# Patient Record
Sex: Male | Born: 1985 | Race: Black or African American | Hispanic: No | Marital: Single | Smoking: Former smoker
Health system: Southern US, Community
[De-identification: ages and names within clinical notes are randomized; demographics above are authoritative.]

## PROBLEM LIST (undated history)

## (undated) DIAGNOSIS — Z21 Asymptomatic human immunodeficiency virus [HIV] infection status: Secondary | ICD-10-CM

## (undated) DIAGNOSIS — I2699 Other pulmonary embolism without acute cor pulmonale: Secondary | ICD-10-CM

---

## 2006-02-15 ENCOUNTER — Ambulatory Visit: Payer: Self-pay | Admitting: Family Medicine

## 2007-06-04 IMAGING — CR DG CHEST 2V
1 series · 2 of 2 positions shown · non-contrast
Comparison: none

REASON FOR EXAM: asthma, cough, shortness of breath
COMMENTS:

PROCEDURE:     DXR - DXR CHEST PA (OR AP) AND LATERAL  - February 15, 2006 [DATE]
RESULT:          The lung fields are clear.  No pneumonia, pneumothorax, or
pleural effusion is seen.  The heart, mediastinal and osseous structures
show no significant abnormalities.

[Series 1: view not recorded · 0.17mm/px · 2 of 2 slices shown]
[im 1/2]
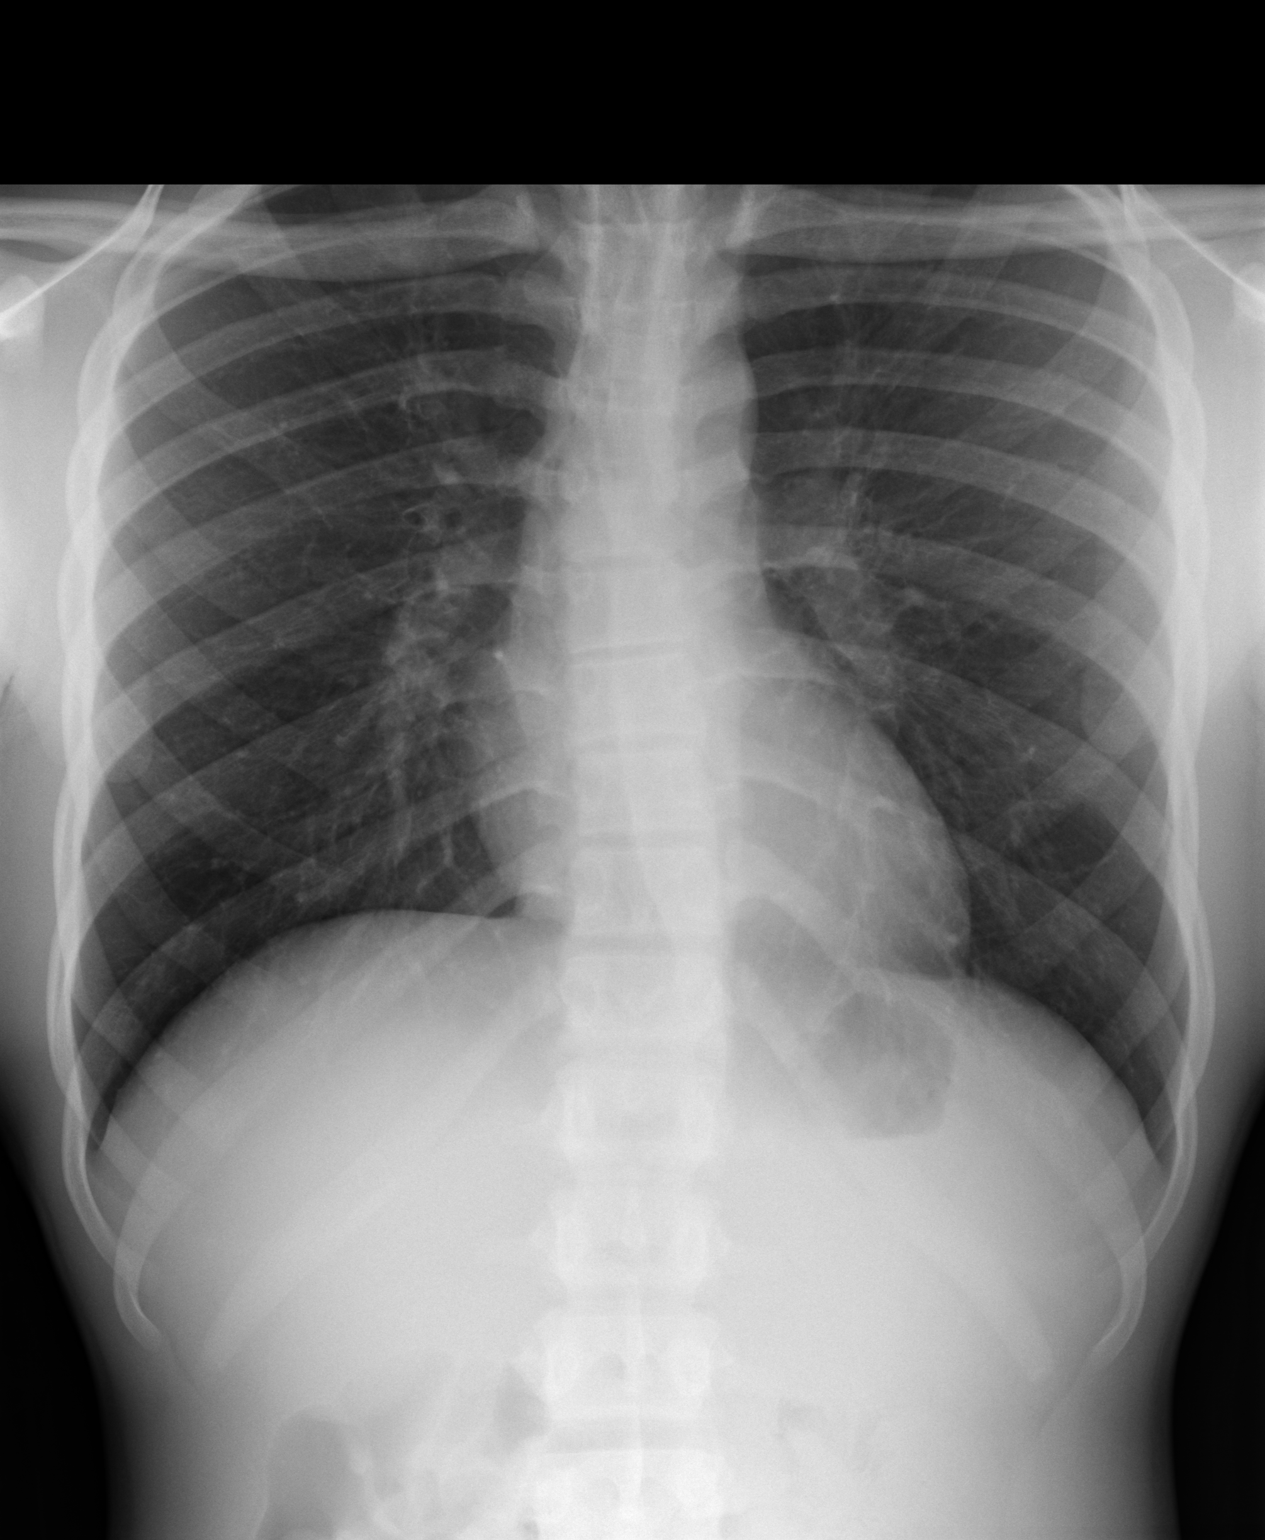
[im 2/2]
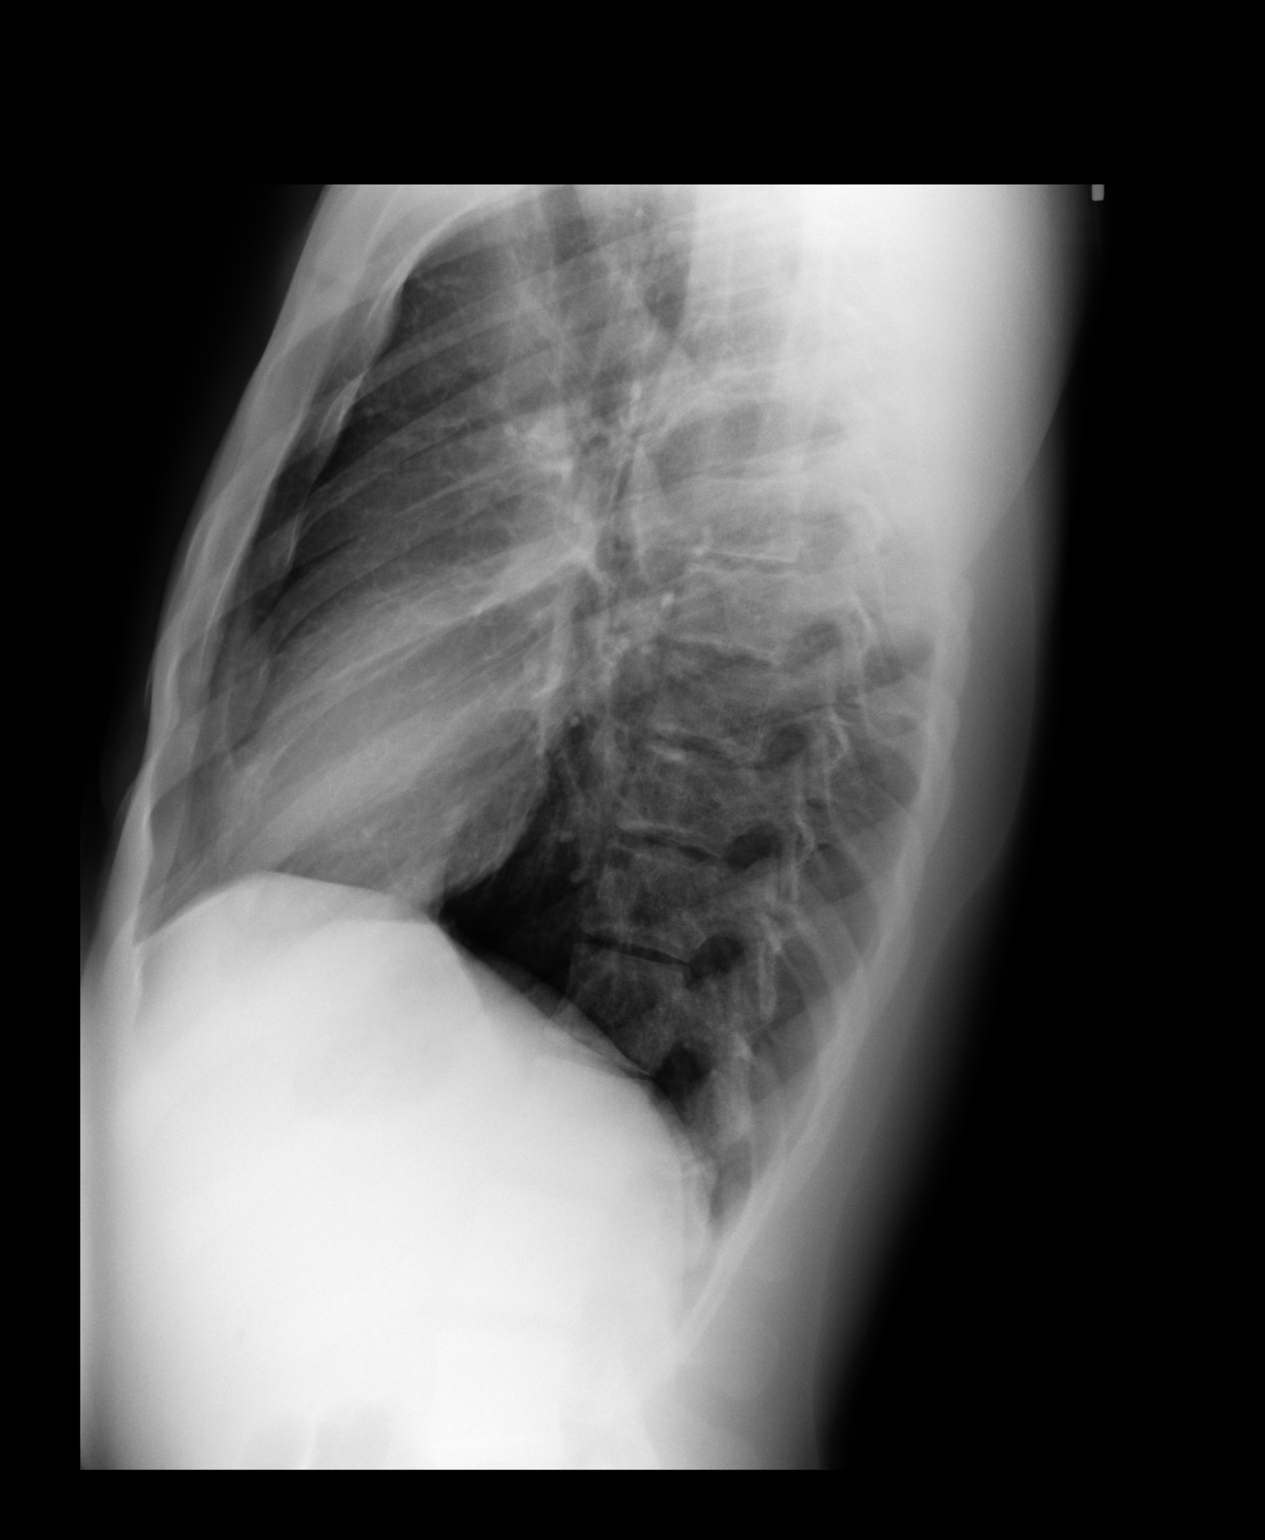

[2 of 2 positions shown; findings below may reference images not displayed]

IMPRESSION: No acute changes are identified.

## 2008-01-18 ENCOUNTER — Inpatient Hospital Stay: Payer: Self-pay | Admitting: Psychiatry

## 2008-04-28 ENCOUNTER — Emergency Department: Payer: Self-pay | Admitting: Emergency Medicine

## 2009-01-16 ENCOUNTER — Emergency Department: Payer: Self-pay | Admitting: Unknown Physician Specialty

## 2010-12-10 ENCOUNTER — Ambulatory Visit: Payer: Self-pay | Admitting: Internal Medicine

## 2012-03-28 IMAGING — CR DG CHEST 2V
1 series · 3 of 3 positions shown · non-contrast
Comparison: none

REASON FOR EXAM: pain with deep breath
COMMENTS:

PROCEDURE:     MDR - MDR CHEST PA(OR AP) AND LATERAL  - December 10, 2010 [DATE]
RESULT:     Comparison: None

[Series 1: view not recorded · 0.17mm/px · 3 of 3 slices shown]
[im 1/3]
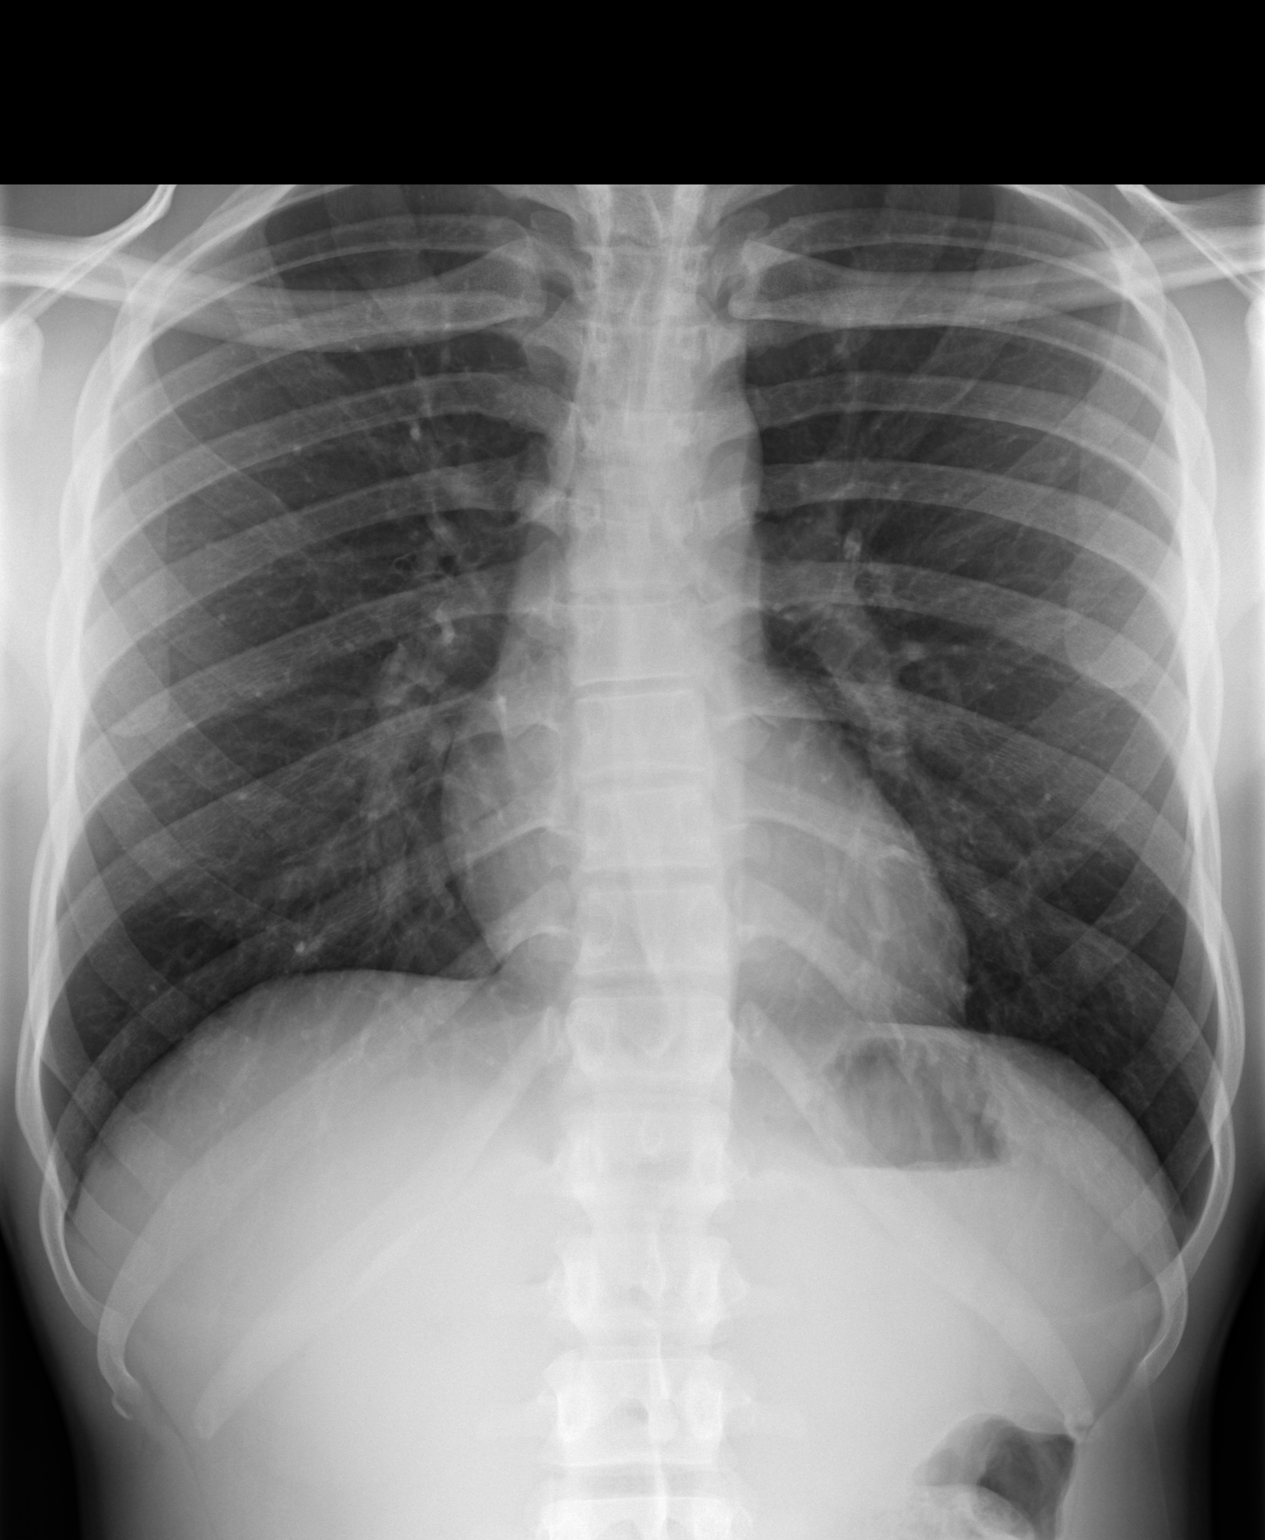
[im 2/3]
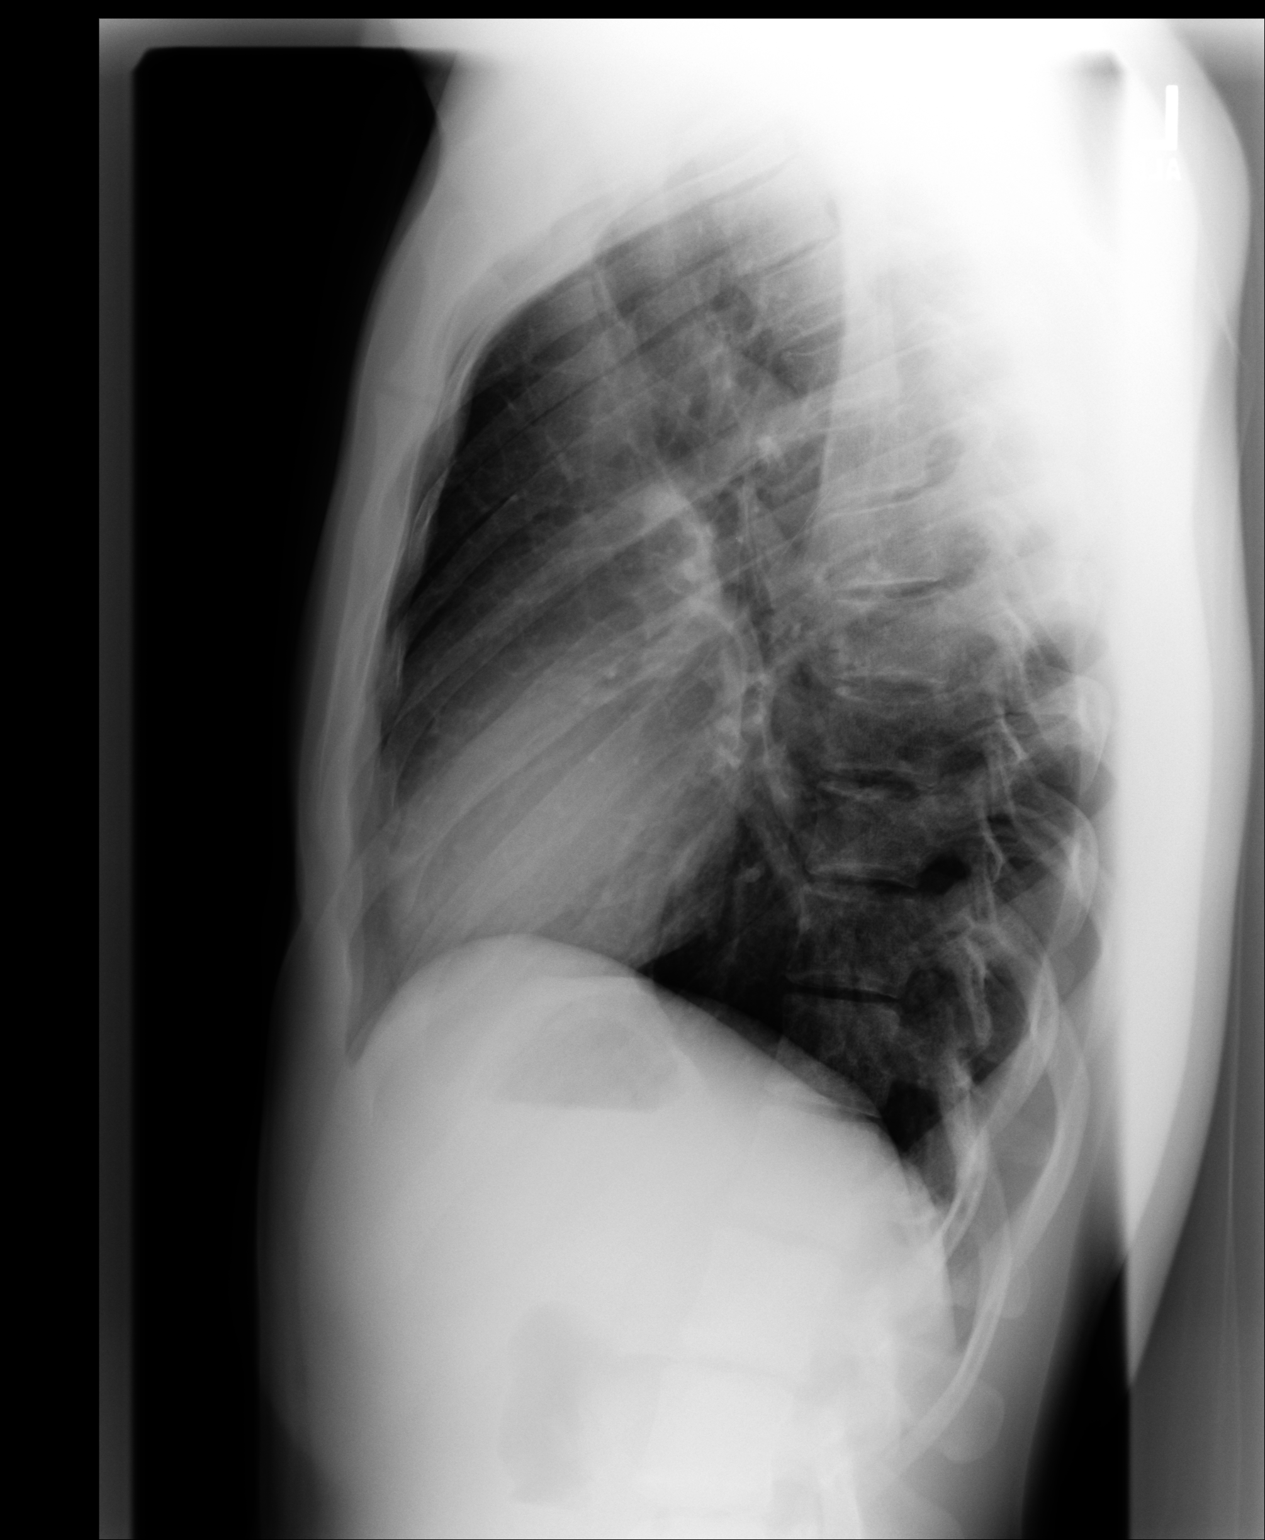
[im 3/3]
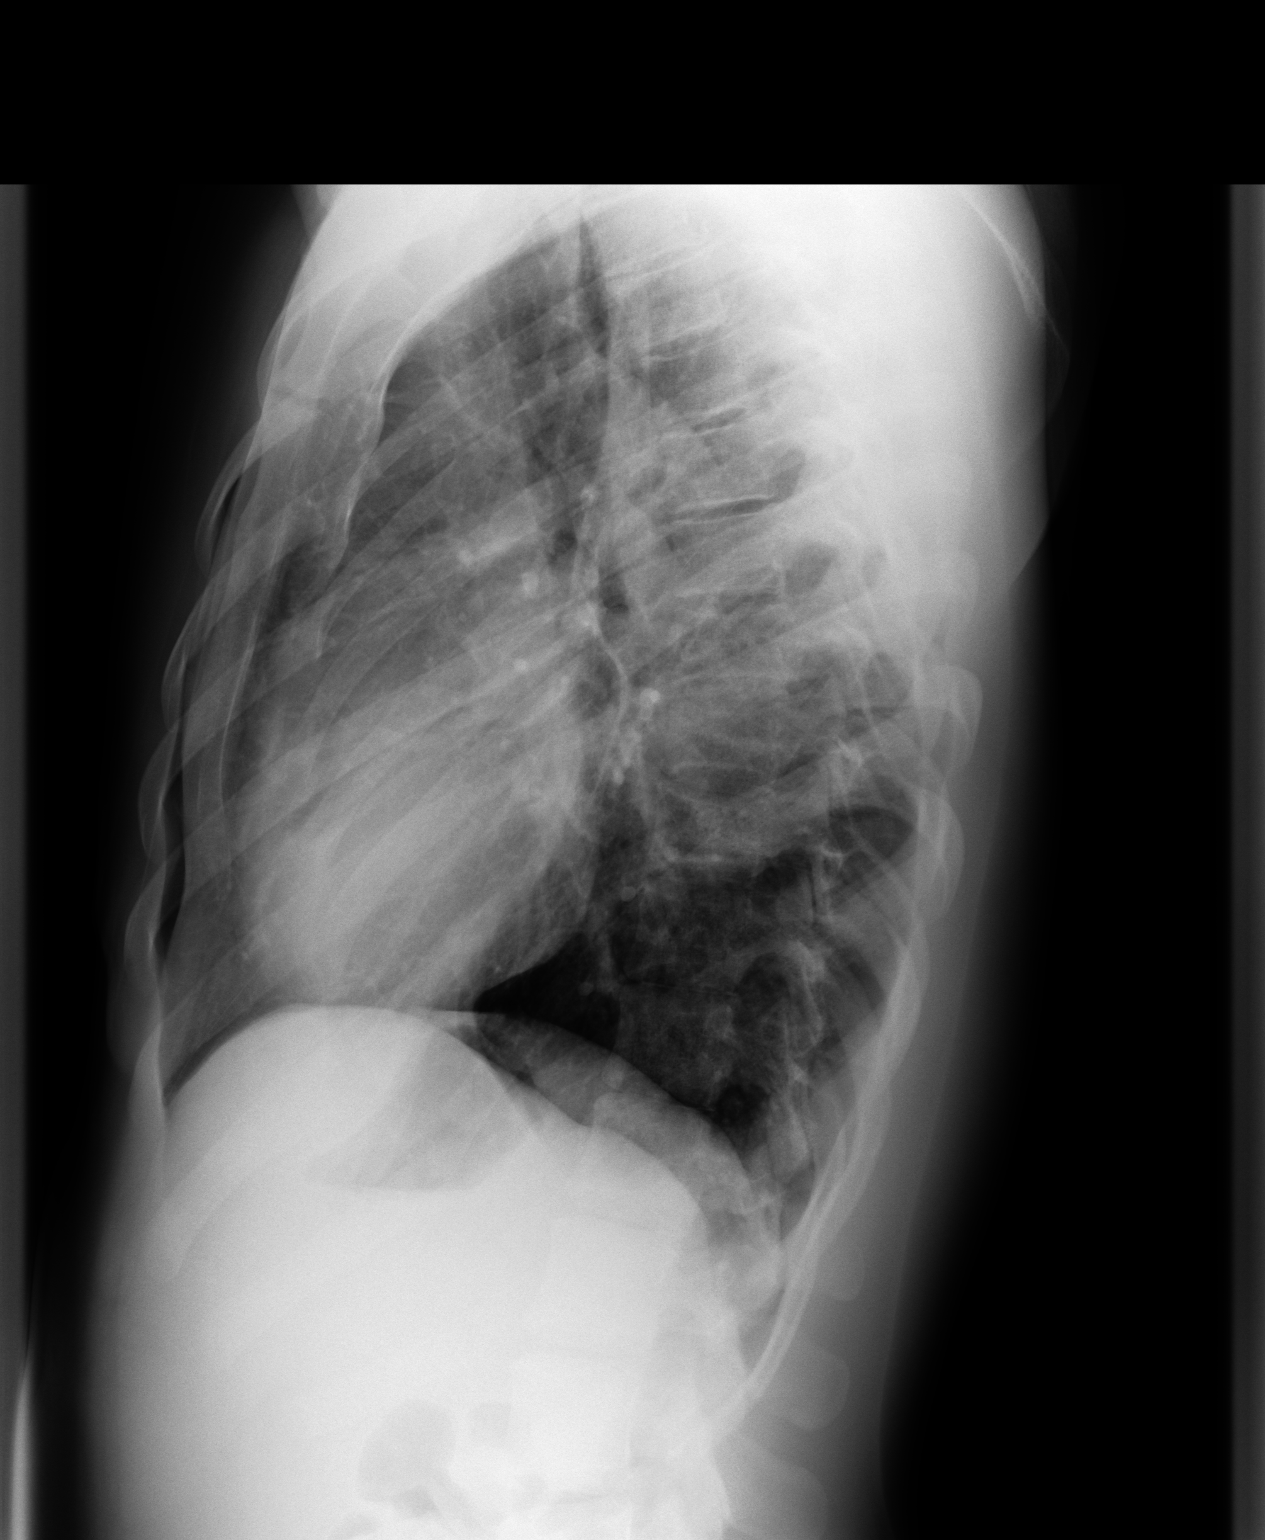

[3 of 3 positions shown; findings below may reference images not displayed]

FINDINGS: PA and lateral chest radiographs are provided.  There is no focal
parenchymal opacity, pleural effusion, or pneumothorax. The heart and
mediastinum are unremarkable.  The osseous structures are unremarkable.
IMPRESSION: No acute disease of the chest.

## 2018-09-16 ENCOUNTER — Encounter: Payer: Self-pay | Admitting: Emergency Medicine

## 2018-09-16 ENCOUNTER — Emergency Department
Admission: EM | Admit: 2018-09-16 | Discharge: 2018-09-16 | Disposition: A | Discharge status: Home / Self Care | Payer: Self-pay | Attending: Emergency Medicine | Admitting: Emergency Medicine

## 2018-09-16 ENCOUNTER — Other Ambulatory Visit: Payer: Self-pay

## 2018-09-16 DIAGNOSIS — Y939 Activity, unspecified: Secondary | ICD-10-CM | POA: Insufficient documentation

## 2018-09-16 DIAGNOSIS — W57XXXA Bitten or stung by nonvenomous insect and other nonvenomous arthropods, initial encounter: Secondary | ICD-10-CM | POA: Insufficient documentation

## 2018-09-16 DIAGNOSIS — T63304A Toxic effect of unspecified spider venom, undetermined, initial encounter: Secondary | ICD-10-CM

## 2018-09-16 DIAGNOSIS — Y929 Unspecified place or not applicable: Secondary | ICD-10-CM | POA: Insufficient documentation

## 2018-09-16 DIAGNOSIS — Y999 Unspecified external cause status: Secondary | ICD-10-CM | POA: Insufficient documentation

## 2018-09-16 DIAGNOSIS — S0086XA Insect bite (nonvenomous) of other part of head, initial encounter: Secondary | ICD-10-CM | POA: Insufficient documentation

## 2018-09-16 NOTE — ED Triage Notes (Signed)
Pt to ED via POV, pt states that he has a bite on his forehead, thinks that it came from a spider. Pt has spider with him.

## 2018-09-16 NOTE — Discharge Instructions (Addendum)
Follow-up with [facility] acute care if any continued problems.  Do not touch, rub or scratch the spider bite area which will increase her chances of getting infected.  You may rub Benadryl cream to it if there is any itching.  If any severe itching you may take Benadryl tablets 1 or 2 every 6 hours but be aware that this medication could cause drowsiness and increase your risk for injury.

## 2018-09-16 NOTE — ED Provider Notes (Signed)
[facility] Emergency Department Provider Note  ____________________________________________   First MD Initiated Contact with Patient 09/16/18 [phone]     (approximate)  I have reviewed the triage vital signs and the nursing notes.   HISTORY  Chief Complaint Insect Bite   HPI Marcus Allison is a 33 y.o. male presents to the ED with complaint of a spider bite to his forehead approximately 11 PM last evening.  Patient states that there is been some itching in the area.  He has not taken any over-the-counter medication or used any topical cream.     History reviewed. No pertinent past medical history.  There are no active problems to display for this patient.   Prior to Admission medications   Not on File    Allergies Tramadol  No family history on file.  Social History Social History   Tobacco Use  . Smoking status: Not on file  Substance Use Topics  . Alcohol use: Not on file  . Drug use: Not on file    Review of Systems Constitutional: No fever/chills Eyes: No visual changes. ENT: No sore throat. Cardiovascular: Denies chest pain. Respiratory: Denies shortness of breath. Gastrointestinal: No abdominal pain.  No nausea, no vomiting. Musculoskeletal: Negative for back pain. Skin: Spider bite. Neurological: Negative for headaches, focal weakness or numbness. ___________________________________________   PHYSICAL EXAM:  VITAL SIGNS: ED Triage Vitals  Enc Vitals Group     BP 09/16/18 0856 140/84     Pulse Rate 09/16/18 0856 70     Resp 09/16/18 0856 18     Temp 09/16/18 0856 98.1 F (36.7 C)     Temp Source 09/16/18 0856 Oral     SpO2 09/16/18 0856 98 %     Weight --      Height --      Head Circumference --      Peak Flow --      Pain Score 09/16/18 0853 5     Pain Loc --      Pain Edu? --      Excl. in [place]? --    Constitutional: Alert and oriented. Well appearing and in no acute distress. Eyes: Conjunctivae are  normal. PERRL. EOMI. Head: Atraumatic. Nose: No congestion/rhinnorhea. Neck: No stridor.   Cardiovascular: Normal rate, regular rhythm. Grossly normal heart sounds.  Good peripheral circulation. Respiratory: Normal respiratory effort.  No retractions. Lungs CTAB. Musculoskeletal: Moves upper and lower extremities without any difficulty.  Normal gait was noted. Neurologic:  Normal speech and language. No gross focal neurologic deficits are appreciated.  Skin:  Skin is warm, dry and intact. No rash noted.  Single alleged spider bite over the left eyebrow. Psychiatric: Mood and affect are normal. Speech and behavior are normal.  ____________________________________________   LABS (all labs ordered are listed, but only abnormal results are displayed)  Labs Reviewed - No data to display ____________________________________________  PROCEDURES  Procedure(s) performed (including Critical Care):  Procedures   ____________________________________________   INITIAL IMPRESSION / ASSESSMENT AND PLAN / ED COURSE  As part of my medical decision making, I reviewed the following data within the electronic MEDICAL RECORD NUMBER Notes from prior ED visits and [place] Controlled Substance Database     33 year old male presents to the ED with complaint of a spider bite above his left eyebrow that occurred at 11 PM last evening.  Patient has the spider with him which appears to be possibly a wolf spider.  Patient was anxious that this was  a brown recluse.  On examination of the spider bite there is a red linear area approximately 0.5 cm without vesiculation or necrosis.  Patient was noted to be rubbing this area while getting information from him.  Patient was instructed to use Benadryl cream to the area if needed for itching and also Benadryl tablets if continued itching.  He is to watch the area for any signs of infection and [facility] if any continued problems.          ____________________________________________   FINAL CLINICAL IMPRESSION(S) / ED DIAGNOSES  Final diagnoses:  Spider bite wound, undetermined intent, initial encounter     ED Discharge Orders    None       Note:  This document was prepared using Dragon voice recognition software and may include unintentional dictation errors.    Tommi RumpsSummers, Garima Chronis L, PA-C 09/16/18 [id number]    Sharyn CreamerQuale, Mark, MD 09/16/18 1454

## 2019-06-11 ENCOUNTER — Ambulatory Visit: Payer: Self-pay

## 2023-08-18 ENCOUNTER — Other Ambulatory Visit: Payer: Self-pay

## 2023-08-18 ENCOUNTER — Emergency Department

## 2023-08-18 ENCOUNTER — Emergency Department
Admission: EM | Admit: 2023-08-18 | Discharge: 2023-08-18 | Disposition: A | Discharge status: Home / Self Care | Attending: Emergency Medicine | Admitting: Emergency Medicine

## 2023-08-18 ENCOUNTER — Encounter: Payer: Self-pay | Admitting: Emergency Medicine

## 2023-08-18 DIAGNOSIS — Z7901 Long term (current) use of anticoagulants: Secondary | ICD-10-CM | POA: Insufficient documentation

## 2023-08-18 DIAGNOSIS — S60811A Abrasion of right wrist, initial encounter: Secondary | ICD-10-CM | POA: Diagnosis not present

## 2023-08-18 DIAGNOSIS — Y92410 Unspecified street and highway as the place of occurrence of the external cause: Secondary | ICD-10-CM | POA: Diagnosis not present

## 2023-08-18 DIAGNOSIS — S6991XA Unspecified injury of right wrist, hand and finger(s), initial encounter: Secondary | ICD-10-CM | POA: Diagnosis present

## 2023-08-18 DIAGNOSIS — Z21 Asymptomatic human immunodeficiency virus [HIV] infection status: Secondary | ICD-10-CM | POA: Insufficient documentation

## 2023-08-18 DIAGNOSIS — M7918 Myalgia, other site: Secondary | ICD-10-CM

## 2023-08-18 HISTORY — DX: Asymptomatic human immunodeficiency virus (hiv) infection status: Z21

## 2023-08-18 HISTORY — DX: Other pulmonary embolism without acute cor pulmonale: I26.99

## 2023-08-18 MED ORDER — ACETAMINOPHEN 500 MG PO TABS
1000.0000 mg | ORAL_TABLET | Freq: Once | ORAL | Status: AC
  Administered 2023-08-18: 1000 mg via ORAL
  Filled 2023-08-18: qty 2

## 2023-08-18 MED ORDER — OXYCODONE HCL 5 MG PO TABS
5.0000 mg | ORAL_TABLET | Freq: Once | ORAL | Status: AC
  Administered 2023-08-18: 5 mg via ORAL
  Filled 2023-08-18: qty 1
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
## 2023-09-06 ENCOUNTER — Emergency Department
Admission: EM | Admit: 2023-09-06 | Discharge: 2023-09-06 | Disposition: A | Discharge status: Home / Self Care | Attending: Emergency Medicine | Admitting: Emergency Medicine

## 2023-09-06 ENCOUNTER — Emergency Department

## 2023-09-06 ENCOUNTER — Other Ambulatory Visit: Payer: Self-pay

## 2023-09-06 ENCOUNTER — Encounter: Payer: Self-pay | Admitting: *Deleted

## 2023-09-06 DIAGNOSIS — R1032 Left lower quadrant pain: Secondary | ICD-10-CM | POA: Insufficient documentation

## 2023-09-06 DIAGNOSIS — Z21 Asymptomatic human immunodeficiency virus [HIV] infection status: Secondary | ICD-10-CM | POA: Diagnosis not present

## 2023-09-06 LAB — COMPREHENSIVE METABOLIC PANEL WITH GFR
ALT: 19 U/L (ref 0–44)
AST: 20 U/L (ref 15–41)
Albumin: 4.2 g/dL (ref 3.5–5.0)
Alkaline Phosphatase: 55 U/L (ref 38–126)
Anion gap: 10 (ref 5–15)
BUN: 7 mg/dL (ref 6–20)
CO2: 25 mmol/L (ref 22–32)
Calcium: 9.3 mg/dL (ref 8.9–10.3)
Chloride: 99 mmol/L (ref 98–111)
Creatinine, Ser: 1.05 mg/dL (ref 0.61–1.24)
GFR, Estimated: >60 mL/min (ref >60)
Glucose, Bld: 114 mg/dL — ABNORMAL HIGH (ref 70–99)
Potassium: 3.3 mmol/L — ABNORMAL LOW (ref 3.5–5.1)
Sodium: 134 mmol/L — ABNORMAL LOW (ref 135–145)
Total Bilirubin: 0.6 mg/dL (ref 0.0–1.2)
Total Protein: 8.1 g/dL (ref 6.5–8.1)

## 2023-09-06 LAB — URINALYSIS, ROUTINE W REFLEX MICROSCOPIC
Bilirubin Urine: NEGATIVE
Glucose, UA: NEGATIVE mg/dL
Hgb urine dipstick: NEGATIVE
Ketones, ur: NEGATIVE mg/dL
Leukocytes,Ua: NEGATIVE
Nitrite: NEGATIVE
Protein, ur: NEGATIVE mg/dL
Specific Gravity, Urine: 1.011 (ref 1.005–1.030)
pH: 8 (ref 5.0–8.0)

## 2023-09-06 LAB — CBC
HCT: 43.5 % (ref 39.0–52.0)
Hemoglobin: 15.4 g/dL (ref 13.0–17.0)
MCH: 32.5 pg (ref 26.0–34.0)
MCHC: 35.4 g/dL (ref 30.0–36.0)
MCV: 91.8 fL (ref 80.0–100.0)
Platelets: 271 10*3/uL (ref 150–400)
RBC: 4.74 MIL/uL (ref 4.22–5.81)
RDW: 12.5 % (ref 11.5–15.5)
WBC: 6.6 10*3/uL (ref 4.0–10.5)
nRBC: 0 % (ref 0.0–0.2)

## 2023-09-06 LAB — LIPASE, BLOOD: Lipase: 32 U/L (ref 11–51)

## 2023-09-06 MED ORDER — ONDANSETRON HCL 4 MG/2ML IJ SOLN
4.0000 mg | INTRAMUSCULAR | Status: AC
  Administered 2023-09-06: 4 mg via INTRAVENOUS
  Filled 2023-09-06: qty 2

## 2023-09-06 MED ORDER — MORPHINE SULFATE (PF) 4 MG/ML IV SOLN
4.0000 mg | Freq: Once | INTRAVENOUS | Status: AC
  Administered 2023-09-06: 4 mg via INTRAVENOUS
  Filled 2023-09-06: qty 1

## 2023-09-06 MED ORDER — IOHEXOL 300 MG/ML  SOLN
100.0000 mL | Freq: Once | INTRAMUSCULAR | Status: AC | PRN
  Administered 2023-09-06: 100 mL via INTRAVENOUS

## 2023-09-06 MED ORDER — KETOROLAC TROMETHAMINE 30 MG/ML IJ SOLN
15.0000 mg | Freq: Once | INTRAMUSCULAR | Status: AC
  Administered 2023-09-06: 15 mg via INTRAVENOUS
  Filled 2023-09-06: qty 1

## 2023-09-06 NOTE — ED Notes (Signed)
 ED Provider at bedside.

## 2023-09-06 NOTE — ED Provider Notes (Signed)
 [facility] Provider Note    Event Date/Time   First MD Initiated Contact with Patient 09/06/23 [phone]     (approximate)   History   Abdominal Pain   HPI Marcus Allison is a 38 y.o. 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  He also reports prior appendectomy.  He is followed by [facility] infectious disease and said he is compliant with his medication regimen and most recently had an undetectable viral load.  He presents tonight for evaluation of about 2 days of gradually worsening left lower quadrant pain.  It radiates somewhat into his left groin.  Heating pads in the LLQ help a bit.  He has no scrotal pain nor swelling, but pressing up on his scrotum reproduces the pain in his lower abdomen.  Denies N/V/D and he has been passing more gas than usual and having normal bowel movements.  Denies fever, CP, SOB.      Physical Exam   Triage Vital Signs: ED Triage Vitals  Encounter Vitals Group     BP 09/06/23 0027 (!) 128/101     Systolic BP Percentile --      Diastolic BP Percentile --      Pulse Rate 09/06/23 0027 90     Resp 09/06/23 0027 (!) 22     Temp 09/06/23 0027 97.8 F (36.6 C)     Temp Source 09/06/23 0027 Oral     SpO2 09/06/23 0027 98 %     Weight 09/06/23 0028 73 kg (161 lb)     Height 09/06/23 0028 1.778 m (5\' 10" )     Head Circumference --      Peak Flow --      Pain Score 09/06/23 0028 9     Pain Loc --      Pain Education --      Exclude from Growth Chart --     Most recent vital signs: Vitals:   09/06/23 0245 09/06/23 0330  BP: 123/89 117/85  Pulse: 83 68  Resp: 18 18  Temp:  98 F (36.7 C)  SpO2: 98% 100%    General: Awake, alert, appears uncomfortable but is not ill-appearing or toxic CV:  Good peripheral perfusion. Regular rate and rhythm. Resp:  Normal effort. Speaking easily and comfortably, no accessory  muscle usage nor intercostal retractions.   Abd/GU:  Thin/muscular body habitus without distention.  Patient guards to palpation of his lower abdomen, particularly the left lower quadrant.  He has no inguinal lymphadenopathy or buboes, no palpable hernia, no reproducible tenderness to palpation of the inguinal region.  Normal-appearing circumcised male external genitalia with no penile lesions.  No scrotal swelling, no tenderness to palpation of the testes and again with no palpable hernia.   ED Results / Procedures / Treatments   Labs (all labs ordered are listed, but only abnormal results are displayed) Labs Reviewed  COMPREHENSIVE METABOLIC PANEL WITH GFR - Abnormal; Notable for the following components:      Result Value   Sodium 134 (*)    Potassium 3.3 (*)    Glucose, Bld 114 (*)    All other components within normal limits  URINALYSIS, ROUTINE W REFLEX MICROSCOPIC - Abnormal; Notable for the following components:   Color, Urine YELLOW (*)    APPearance CLEAR (*)    All other components within normal limits  LIPASE, BLOOD  CBC      RADIOLOGY See ED course for details   PROCEDURES:  Critical Care performed: No  Procedures    IMPRESSION / MDM / ASSESSMENT AND PLAN / ED COURSE  I reviewed the triage vital signs and the nursing notes.                              Differential diagnosis includes, but is not limited to, diverticulitis, hernia, renal/ureteral colic, UTI/pyelonephritis, STD complication.  Patient's presentation is most consistent with acute presentation with potential threat to life or bodily function.  Labs/studies ordered: CMP, lipase, CBC, urinalysis, CT abdomen/pelvis  Interventions/Medications given:  Medications  morphine (PF) 4 MG/ML injection 4 mg (4 mg Intravenous Given 09/06/23 0318)  ondansetron (ZOFRAN) injection 4 mg (4 mg Intravenous Given 09/06/23 0315)  ketorolac (TORADOL) 30 MG/ML injection 15 mg (15 mg Intravenous Given 09/06/23 0317)   iohexol (OMNIPAQUE) 300 MG/ML solution 100 mL (100 mLs Intravenous Contrast Given 09/06/23 0342)    (Note:  hospital course my include additional interventions and/or labs/studies not listed above.)   Vital signs are stable and within normal limits and his lab work is all essentially normal other than very minimal electrolyte abnormalities.  No leukocytosis.  Patient has not yet provided a urine specimen.  He is, however, very uncomfortable and guarding on abdominal exam.  Physical exam does not suggest a GU cause of his pain at this time, although ureteral colic is possible.  I ordered morphine 4 mg IV, Zofran 4 mg IV, and Toradol 15 mg IV.  Proceeding with CT of the abdomen and pelvis.  Patient understands and agrees with the plan.     Clinical Course as of 09/06/23 0541  Tue Sep 06, 2023  0352 Urinalysis, Routine w reflex microscopic -Urine, Clean Catch(!) Normal urinalysis [CF]  0519 CT ABDOMEN PELVIS W CONTRAST I viewed and interpreted the patient's CT of the abdomen and pelvis and I see no obvious sign of infection or SBO/ileus or hernia.  Radiologist commented on slightly thickened bladder wall and suggested correlation with urinalysis but also did not identify any acute abnormalities. [CF]  0540 Patient is feeling better and has been resting comfortably.  I updated him about the reassuring results and he is comfortable with the plan for discharge and outpatient follow-up.  I gave my usual and customary follow-up recommendations and return precautions. [CF]    Clinical Course User Index [CF] Lynnda Sas, MD     FINAL CLINICAL IMPRESSION(S) / ED DIAGNOSES   Final diagnoses:  Left lower quadrant abdominal pain     Rx / DC Orders   ED Discharge Orders     None        Note:  This document was prepared using Dragon voice recognition software and may include unintentional dictation errors.   Lynnda Sas, MD 09/06/23 [phone]

## 2023-09-06 NOTE — Discharge Instructions (Signed)

## 2023-09-06 NOTE — ED Triage Notes (Signed)
 Pt ambulatory to triage.  Pt has left side abd pain for 2 days.  No v/d  no back pain.   Pt took gas x without relief.   Pt alert

## 2023-09-11 ENCOUNTER — Emergency Department
Admission: EM | Admit: 2023-09-11 | Discharge: 2023-09-11 | Disposition: A | Discharge status: Home / Self Care | Attending: Emergency Medicine | Admitting: Emergency Medicine

## 2023-09-11 DIAGNOSIS — R1032 Left lower quadrant pain: Secondary | ICD-10-CM | POA: Insufficient documentation

## 2023-09-11 DIAGNOSIS — Z21 Asymptomatic human immunodeficiency virus [HIV] infection status: Secondary | ICD-10-CM | POA: Insufficient documentation

## 2023-09-11 LAB — CBC
HCT: 44.6 % (ref 39.0–52.0)
Hemoglobin: 15.9 g/dL (ref 13.0–17.0)
MCH: 32.4 pg (ref 26.0–34.0)
MCHC: 35.7 g/dL (ref 30.0–36.0)
MCV: 90.8 fL (ref 80.0–100.0)
Platelets: 317 10*3/uL (ref 150–400)
RBC: 4.91 MIL/uL (ref 4.22–5.81)
RDW: 12.4 % (ref 11.5–15.5)
WBC: 6.8 10*3/uL (ref 4.0–10.5)
nRBC: 0 % (ref 0.0–0.2)

## 2023-09-11 LAB — COMPREHENSIVE METABOLIC PANEL WITH GFR
ALT: 18 U/L (ref 0–44)
AST: 23 U/L (ref 15–41)
Albumin: 4 g/dL (ref 3.5–5.0)
Alkaline Phosphatase: 54 U/L (ref 38–126)
Anion gap: 10 (ref 5–15)
BUN: 12 mg/dL (ref 6–20)
CO2: 24 mmol/L (ref 22–32)
Calcium: 9.9 mg/dL (ref 8.9–10.3)
Chloride: 100 mmol/L (ref 98–111)
Creatinine, Ser: 0.99 mg/dL (ref 0.61–1.24)
GFR, Estimated: >60 mL/min (ref >60)
Glucose, Bld: 67 mg/dL — ABNORMAL LOW (ref 70–99)
Potassium: 3.9 mmol/L (ref 3.5–5.1)
Sodium: 134 mmol/L — ABNORMAL LOW (ref 135–145)
Total Bilirubin: 0.8 mg/dL (ref 0.0–1.2)
Total Protein: 7.7 g/dL (ref 6.5–8.1)

## 2023-09-11 LAB — URINALYSIS, ROUTINE W REFLEX MICROSCOPIC
Bilirubin Urine: NEGATIVE
Glucose, UA: NEGATIVE mg/dL
Hgb urine dipstick: NEGATIVE
Ketones, ur: NEGATIVE mg/dL
Leukocytes,Ua: NEGATIVE
Nitrite: NEGATIVE
Protein, ur: NEGATIVE mg/dL
Specific Gravity, Urine: 1.018 (ref 1.005–1.030)
pH: 7 (ref 5.0–8.0)

## 2023-09-11 MED ORDER — DICYCLOMINE HCL 20 MG PO TABS: 20.0000 mg | ORAL_TABLET | Freq: Four times a day (QID) | ORAL | 0 refills | Status: AC

## 2023-09-11 MED ORDER — KETOROLAC TROMETHAMINE 15 MG/ML IJ SOLN
15.0000 mg | Freq: Once | INTRAMUSCULAR | Status: AC
  Administered 2023-09-11: 15 mg via INTRAVENOUS
  Filled 2023-09-11: qty 1

## 2023-09-11 MED ORDER — PANTOPRAZOLE SODIUM 40 MG IV SOLR
40.0000 mg | Freq: Once | INTRAVENOUS | Status: AC
  Administered 2023-09-11: 40 mg via INTRAVENOUS
  Filled 2023-09-11: qty 10

## 2023-09-11 MED ORDER — DICYCLOMINE HCL 10 MG/ML IM SOLN
20.0000 mg | Freq: Once | INTRAMUSCULAR | Status: AC
  Administered 2023-09-11: 20 mg via INTRAMUSCULAR
  Filled 2023-09-11: qty 2

## 2023-09-11 MED ORDER — OXYCODONE-ACETAMINOPHEN 5-325 MG PO TABS: 1.0000 | ORAL_TABLET | Freq: Three times a day (TID) | ORAL | 0 refills | Status: AC | PRN

## 2023-09-11 MED ORDER — ONDANSETRON HCL 4 MG/2ML IJ SOLN
4.0000 mg | Freq: Once | INTRAMUSCULAR | Status: AC
  Administered 2023-09-11: 4 mg via INTRAVENOUS
  Filled 2023-09-11: qty 2

## 2023-09-11 MED ORDER — OXYCODONE-ACETAMINOPHEN 5-325 MG PO TABS
1.0000 | ORAL_TABLET | Freq: Once | ORAL | Status: AC
  Administered 2023-09-11: 1 via ORAL
  Filled 2023-09-11: qty 1

## 2023-09-11 MED ORDER — MORPHINE SULFATE (PF) 4 MG/ML IV SOLN
4.0000 mg | Freq: Once | INTRAVENOUS | Status: AC
  Administered 2023-09-11: 4 mg via INTRAVENOUS
  Filled 2023-09-11: qty 1

## 2023-09-11 NOTE — ED Provider Notes (Signed)
 [facility] Provider Note    Event Date/Time   First MD Initiated Contact with Patient 09/11/23 1159     (approximate)   History   Abdominal Pain   HPI  Marcus Allison is a 38 y.o. male with a history of generally well-controlled HIV with history of rectal condyloma scheduled for surgery next week who presents with complaints of left lower quadrant abdominal pain.  Patient was seen here 3 days ago for the same complaint.  At that time he had a CT scan which was reassuring.  He reports he had been feeling better but the pain is returned.  He does use marijuana but denies significant nausea or vomiting at this time     Physical Exam   Triage Vital Signs: ED Triage Vitals  Encounter Vitals Group     BP 09/11/23 1138 (!) 128/95     Systolic BP Percentile --      Diastolic BP Percentile --      Pulse Rate 09/11/23 1138 80     Resp 09/11/23 1138 20     Temp 09/11/23 1138 98.3 F (36.8 C)     Temp Source 09/11/23 1138 Oral     SpO2 09/11/23 1138 98 %     Weight 09/11/23 1137 74.8 kg (165 lb)     Height 09/11/23 1137 1.778 m (5\' 10" )     Head Circumference --      Peak Flow --      Pain Score 09/11/23 1137 10     Pain Loc --      Pain Education --      Exclude from Growth Chart --     Most recent vital signs: Vitals:   09/11/23 1138  BP: (!) 128/95  Pulse: 80  Resp: 20  Temp: 98.3 F (36.8 C)  SpO2: 98%     General: Awake, no distress.  CV:  Good peripheral perfusion.  Resp:  Normal effort.  Abd:  No distention.  No significant tenderness to palpation of the abdomen.  Normal scrotal exam, no swelling, no tenderness Other:     ED Results / Procedures / Treatments   Labs (all labs ordered are listed, but only abnormal results are displayed) Labs Reviewed  COMPREHENSIVE METABOLIC PANEL WITH GFR - Abnormal; Notable for the following components:      Result Value   Sodium 134 (*)    Glucose, Bld 67 (*)    All other components  within normal limits  URINALYSIS, ROUTINE W REFLEX MICROSCOPIC - Abnormal; Notable for the following components:   Color, Urine YELLOW (*)    APPearance CLEAR (*)    All other components within normal limits  CBC     EKG     RADIOLOGY     PROCEDURES:  Critical Care performed:   Procedures   MEDICATIONS ORDERED IN ED: Medications  morphine  (PF) 4 MG/ML injection 4 mg (4 mg Intravenous Given 09/11/23 1220)  ondansetron  (ZOFRAN ) injection 4 mg (4 mg Intravenous Given 09/11/23 1220)  ketorolac  (TORADOL ) 15 MG/ML injection 15 mg (15 mg Intravenous Given 09/11/23 1220)  pantoprazole (PROTONIX) injection 40 mg (40 mg Intravenous Given 09/11/23 1350)  dicyclomine (BENTYL) injection 20 mg (20 mg Intramuscular Given 09/11/23 1350)  oxyCODONE -acetaminophen  (PERCOCET/ROXICET) 5-325 MG per tablet 1 tablet (1 tablet Oral Given 09/11/23 1627)     IMPRESSION / MDM / ASSESSMENT AND PLAN / ED COURSE  I reviewed the triage vital signs and the nursing notes. Patient's presentation  is most consistent with acute presentation with potential threat to life or bodily function.  Patient presents with left lower quadrant abdominal pain as detailed above, given that he had CT abdomen pelvis 3 days ago which was quite reassuring, doubt diverticulitis or ureterolithiasis, question abdominal pain related to marijuana use,  Will treat with IV morphine , IV Zofran , IV Toradol , recheck labs, urinalysis and reevaluate.  Patient had improvement after treatment but still continued pain, will also give IV Protonix and IM Bentyl  ----------------------------------------- 3:50 PM on 09/11/2023 ----------------------------------------- Significantly improved, suspect this is the result of the IM Bentyl,  Prior to discharge patient reports his pain was returning.  Given this I recommended that we admit the patient to the hospital however he adamantly declined, he reports he has infectious disease appointment tomorrow  that he does not want to miss under any circumstances.  He knows that he can return at any time, will Rx analgesic      FINAL CLINICAL IMPRESSION(S) / ED DIAGNOSES   Final diagnoses:  Left lower quadrant abdominal pain     Rx / DC Orders   ED Discharge Orders          Ordered    dicyclomine (BENTYL) 20 MG tablet  Every 6 hours        09/11/23 1541    oxyCODONE -acetaminophen  (PERCOCET) 5-325 MG tablet  Every 8 hours PRN        09/11/23 1622             Note:  This document was prepared using Dragon voice recognition software and may include unintentional dictation errors.   Bryson Carbine, MD 09/11/23 [phone]

## 2023-09-11 NOTE — ED Triage Notes (Signed)
 Pt c/o worsening LLQ abd pain for the last week. Pt denies N/V/D. States he was recently seen for same and has scheduled surgery later in the month. Pt endorses marijuana use 2-3x per week, last use this morning. Pt states that heat sometimes helps the pain.
